# Patient Record
Sex: Female | Born: 1975 | Race: White | Hispanic: No | Marital: Single | Smoking: Current every day smoker
Health system: Southern US, Community
[De-identification: ages and names within clinical notes are randomized; demographics above are authoritative.]

## PROBLEM LIST (undated history)

## (undated) DIAGNOSIS — T7840XA Allergy, unspecified, initial encounter: Secondary | ICD-10-CM

## (undated) DIAGNOSIS — M810 Age-related osteoporosis without current pathological fracture: Secondary | ICD-10-CM

## (undated) HISTORY — PX: COLONOSCOPY: SHX174

## (undated) HISTORY — DX: Age-related osteoporosis without current pathological fracture: M81.0

## (undated) HISTORY — DX: Allergy, unspecified, initial encounter: T78.40XA

---

## 2017-09-15 ENCOUNTER — Other Ambulatory Visit: Payer: Self-pay | Admitting: Family Medicine

## 2017-09-15 ENCOUNTER — Other Ambulatory Visit: Payer: Self-pay

## 2017-09-15 ENCOUNTER — Encounter: Payer: Self-pay | Admitting: Family Medicine

## 2017-09-15 ENCOUNTER — Ambulatory Visit (INDEPENDENT_AMBULATORY_CARE_PROVIDER_SITE_OTHER): Payer: 59

## 2017-09-15 ENCOUNTER — Ambulatory Visit: Payer: 59 | Admitting: Family Medicine

## 2017-09-15 VITALS — BP 100/64 | HR 80 | Temp 99.1°F | Resp 16 | Ht 69.0 in | Wt 148.2 lb

## 2017-09-15 DIAGNOSIS — E2839 Other primary ovarian failure: Secondary | ICD-10-CM

## 2017-09-15 DIAGNOSIS — E28319 Asymptomatic premature menopause: Secondary | ICD-10-CM

## 2017-09-15 DIAGNOSIS — F17210 Nicotine dependence, cigarettes, uncomplicated: Secondary | ICD-10-CM

## 2017-09-15 DIAGNOSIS — M25551 Pain in right hip: Secondary | ICD-10-CM | POA: Diagnosis not present

## 2017-09-15 DIAGNOSIS — F17200 Nicotine dependence, unspecified, uncomplicated: Secondary | ICD-10-CM

## 2017-09-15 DIAGNOSIS — E288 Other ovarian dysfunction: Secondary | ICD-10-CM | POA: Diagnosis not present

## 2017-09-15 DIAGNOSIS — Z1231 Encounter for screening mammogram for malignant neoplasm of breast: Secondary | ICD-10-CM

## 2017-09-15 DIAGNOSIS — M85851 Other specified disorders of bone density and structure, right thigh: Secondary | ICD-10-CM | POA: Diagnosis not present

## 2017-09-15 NOTE — Patient Instructions (Addendum)
IF you received an x-ray today, you will receive an invoice from [facility]. Please contact [facility] at [phone] with questions or concerns regarding your invoice.   IF you received labwork today, you will receive an invoice from [facility]. Please contact [facility] at [phone] with questions or concerns regarding your invoice.   Our billing staff will not be able to assist you with questions regarding bills from these companies.  You will be contacted with the lab results as soon as they are available. The fastest way to get your results is to activate your My Chart account. Instructions are located on the last page of this paperwork. If you have not heard from [place] regarding the results in 2 weeks, please contact this office.    We recommend that you schedule a mammogram for breast cancer screening. Typically, you do not need a referral to do this. Please contact a local imaging center to schedule your mammogram.  [facility] - [phone]  *ask for the Radiology Department [facility]) - [phone] or [phone]  [facility] - [phone] [facility] - [phone] [facility] - [phone]  *ask for the Radiology Department [facility] - [phone]  *ask for the Radiology Department [facility] - [phone]  *ask for the Mammography Department [facility] - [phone] Hip Pain The hip is the joint between the upper legs and the lower pelvis. The bones, cartilage, tendons, and muscles of your hip joint support your body and allow you to move around. Hip pain can range from a minor ache to severe pain in one or both of your hips. The pain may be felt on the inside of the hip joint near the groin, or the outside near the buttocks and upper thigh. You may also have swelling or stiffness. Follow these instructions at home: Managing pain,  stiffness, and swelling  If directed, apply ice to the injured area. ? Put ice in a plastic bag. ? Place a towel between your skin and the bag. ? Leave the ice on for 20 minutes, 2-3 times a day  Sleep with a pillow between your legs on your most comfortable side.  Avoid any activities that cause pain. General instructions  Take over-the-counter and prescription medicines only as told by your health care provider.  Do any exercises as told by your health care provider.  Record the following: ? How often you have hip pain. ? The location of your pain. ? What the pain feels like. ? What makes the pain worse.  Keep all follow-up visits as told by your health care provider. This is important. Contact a health care provider if:  You cannot put weight on your leg.  Your pain or swelling continues or gets worse after one week.  It gets harder to walk.  You have a fever. Get help right away if:  You fall.  You have a sudden increase in pain and swelling in your hip.  Your hip is red or swollen or very tender to touch. Summary  Hip pain can range from a minor ache to severe pain in one or both of your hips.  The pain may be felt on the inside of the hip joint near the groin, or the outside near the buttocks and upper thigh.  Avoid any activities that cause pain.  Record how often you have hip pain, the location of the pain,  what makes it worse and what it feels like. This information is not intended to replace advice given to you by your health care provider. Make sure you discuss any questions you have with your health care provider. Document Released: 08/06/2009 Document Revised: 01/20/2016 Document Reviewed: 01/20/2016 Elsevier Interactive Patient Education  [employer]2018 [employer] Inc.

## 2017-09-15 NOTE — Progress Notes (Signed)
Chief Complaint  Patient presents with  . New Patient (Initial Visit)  . right hip pain    x 1 week, no otc meds, goes to pool and works it out.    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 Allergy   . Osteoporosis    right hip    No current outpatient medications on file.   No current facility-administered medications for this visit.     Allergies: No Known Allergies  History reviewed. No pertinent surgical history.  Social History   Socioeconomic History  . Marital status: Single    Spouse name: Not on file  . Number of children: Not on file  . Years of education: Not on file  . Highest education level: Not on file  Occupational History  . Not on file  Social Needs  . Financial resource strain: Not on file  . Food insecurity:    Worry: Not on file    Inability: Not on file  . Transportation needs:    Medical: Not on file    Non-medical: Not on file  Tobacco Use  . Smoking status: Current Every Day Smoker    Packs/day: 0.50    Years: 20.00    Pack years: 10.00    Types: Cigarettes  . Smokeless tobacco: Never Used  Substance and Sexual Activity  . Alcohol use: Yes    Alcohol/week: 0.6 - 1.2 oz    Types: 1 - 2 Glasses of wine per week  . Drug use: Never  . Sexual activity: Not on file  Lifestyle  . Physical activity:    Days  per week: Not on file    Minutes per session: Not on file  . Stress: Not on file  Relationships  . Social connections:    Talks on phone: Not on file    Gets together: Not on file    Attends religious service: Not on file    Active member of club or organization: Not on file    Attends meetings of clubs or organizations: Not on file    Relationship status: Not on file  Other Topics Concern  . Not on file  Social History Narrative  . Not on file    Family History  Problem Relation Age of Onset  . Heart disease Neg Hx   . Obesity Neg Hx      ROS Review of Systems See HPI Constitution: No fevers or chills No malaise No diaphoresis Skin: No rash or itching Eyes: no blurry vision, no double vision GU: no dysuria or hematuria Neuro: no dizziness or headaches all others reviewed and negative   Objective: Vitals:   09/15/17 1151  BP: 100/64  Pulse: 80  Resp: 16  Temp: 99.1 F (37.3 C)  TempSrc: Oral  SpO2: 97%  Weight: 148 lb 3.2 oz (67.2 kg)  Height: 5\' 9"  (1.753 m)    Physical Exam  Constitutional: She is oriented to person, place, and time. She appears well-developed and well-nourished.  HENT:  Head: Normocephalic and atraumatic.  Eyes: Conjunctivae and EOM are normal.  Cardiovascular: Normal rate, regular rhythm and normal heart sounds.  No murmur heard. Pulmonary/Chest: Effort normal and breath sounds normal. No stridor. No respiratory distress. She has no wheezes.  Musculoskeletal:       Right hip: She exhibits tenderness and bony tenderness. She exhibits normal range of motion, normal strength, no swelling, no crepitus and no deformity.       Left hip: Normal. She exhibits normal range of motion, normal strength, no tenderness, no bony tenderness and no swelling.       Right knee: She exhibits normal range of motion, no swelling and no effusion. No tenderness found.       Left knee: She exhibits normal range of motion, no swelling and no effusion. No  tenderness found.  Neurological: She is alert and oriented to person, place, and time.  Skin: Skin is warm. Capillary refill takes less than 2 seconds.  Psychiatric: She has a normal mood and affect. Her behavior is normal. Judgment and thought content normal.   CLINICAL DATA:  Pain  EXAM: DG HIP (WITH OR WITHOUT PELVIS) 2V BILAT  COMPARISON:  None.  FINDINGS: Frontal pelvis with frontal and lateral views of each hip obtained. No fracture or dislocation. Joint spaces appear normal. No erosive change.  IMPRESSION: No fracture or dislocation.  No evident arthropathy.   Electronically Signed   By: Bretta BangWilliam  Woodruff III M.D.   On: 09/15/2017 12:35    Assessment and Plan Para MarchJeanette was seen today for new patient (initial visit) and right hip pain.  Diagnoses and all orders for this visit:  Acute right hip pain- discussed referral Advised NSAIDs Discussed evaluation for other pathology such as ligamentous injury -     DG HIPS BILAT W OR W/O PELVIS 2V; Future -     Ambulatory referral to Orthopedic Surgery  Tobacco use disorder- discussed smoking cessation  Osteopenia of right hip Premature ovarian failure Early menopause occurring in patient age younger than 45 years -     Ambulatory referral to Orthopedic Surgery -     Discussed following up with Orthopedics -     Xray today no bone disease      Halli Equihua A Adarrius Graeff

## 2017-09-24 ENCOUNTER — Telehealth: Payer: Self-pay | Admitting: Family Medicine

## 2017-09-24 NOTE — Telephone Encounter (Signed)
I called and talked with pt. Pt is very upset that she didn't get MRI done.Pt states that she felt like she was manipulated into getting a cortisone injection for bursitis . She states that she is in a lot of pain. I asked the pt if she would like to be seen today or tommrow with another provider that Dr. Creta LevinStallings not in the office today Pt states no that she could not drive today and someone is coming tomorrow to fix her A.C. Unit. I stated for the pt to seek medical help if her symptoms worsen. Pt would like to talk with Dr. Creta LevinStallings.

## 2017-09-24 NOTE — Telephone Encounter (Signed)
Copied from CRM [id number]. Topic: Quick Communication - See Telephone Encounter >> Sep 24, 2017  8:41 AM Arlyss Gandyichardson, Tykee Heideman N, NT wrote: CRM for notification. See Telephone encounter for: 09/24/17. Pt would like a call to discuss her hip pain. She was thinking she would have a MRI done, but the orthopedic doctor only did a cortisone injection in her right hip. She states during her  appointment with Dr. Creta LevinStallings she mentioned a MRI and to have both hips checked but she states during her orthopedic visit this did not happen or get scheduled. Patient requesting a call back.

## 2017-09-28 ENCOUNTER — Telehealth: Payer: Self-pay

## 2017-09-28 NOTE — Telephone Encounter (Signed)
[facility] calling to advise pt is upset that no one has called her back re: MRI and pain she is still in.  Pt spoke with Lynda on 09/24/17 and was offered an appt on Friday or Saturday pt declined due to other obligations.   Pt calling [facility] today that no one has called her back and she has left messages for her doctor to call her.  [facility] is asking for someone to speak with pt.  Spoke with pt an advised I could give dr Creta Levinstallings the message re: the bursitis dx given by ortho and how she felt they twisted her arm in to getting it instead of MRI and pt goes on to c/o how they laughed at her and dr Creta Levinstallings dx/referral. Pt continues to go on about how much pain she is still in and how no one cares and she will have to find another office as she is super disappointed in the treatment she has received.  Again I advised I work directly with dr Creta Levinstallings and can give her the message to give you a call.  Pt  goes on to point out why wasn't the message given on Friday I advised dr Creta Levinstallings is off on Friday as well as myself.  I advised dr Creta Levinstallings is our new Wellsite geologistmedical director and sees pt's during clinics and can not always return calls same day as in this situation she could not call you back because she wasn't here.  I offered again to give message but pt states she is tried of the whole situation and will be seeking care elsewhere.  I advised pt that is her choice and wished her a good day.

## 2017-10-04 NOTE — Telephone Encounter (Signed)
Spoke with patient on 09/28/17 to find out how best to help her with this matter.  Expressed that she can go to a different orthopedic group for second opinion.  Discussed that it is up the specialist to manage how they see fit and that if her pain is unimproved that she can be referred else where. She states "I don't know how I will handle this."  Asked her if there is anything else that can be done and she stated "not at this time".

## 2017-10-06 ENCOUNTER — Ambulatory Visit
Admission: RE | Admit: 2017-10-06 | Discharge: 2017-10-06 | Disposition: A | Discharge status: Home / Self Care | Payer: 59 | Source: Ambulatory Visit | Attending: Family Medicine | Admitting: Family Medicine

## 2017-10-06 DIAGNOSIS — Z1231 Encounter for screening mammogram for malignant neoplasm of breast: Secondary | ICD-10-CM

## 2017-10-07 ENCOUNTER — Other Ambulatory Visit: Payer: Self-pay | Admitting: Family Medicine

## 2017-10-07 DIAGNOSIS — R928 Other abnormal and inconclusive findings on diagnostic imaging of breast: Secondary | ICD-10-CM

## 2017-10-11 ENCOUNTER — Ambulatory Visit
Admission: RE | Admit: 2017-10-11 | Discharge: 2017-10-11 | Disposition: A | Discharge status: Home / Self Care | Payer: 59 | Source: Ambulatory Visit | Attending: Family Medicine | Admitting: Family Medicine

## 2017-10-11 DIAGNOSIS — R928 Other abnormal and inconclusive findings on diagnostic imaging of breast: Secondary | ICD-10-CM

## 2017-11-24 ENCOUNTER — Encounter: Payer: Self-pay | Admitting: Family Medicine

## 2017-11-24 ENCOUNTER — Telehealth: Payer: Self-pay | Admitting: Family Medicine

## 2017-11-24 NOTE — Telephone Encounter (Signed)
Copied from CRM [id number]. 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

## 2017-11-25 NOTE — Telephone Encounter (Signed)
Patient was advised that the orthopedic doctor could write her the note.  Or she could come back in to be seen. Patient denied appointment and said she would go elsewhere

## 2019-07-25 IMAGING — MG DIGITAL DIAGNOSTIC UNILATERAL RIGHT MAMMOGRAM
1 series · 2 of 5 positions shown · non-contrast
Comparison: Previous exam(s).

CLINICAL DATA: Screening recall.  Patient has no breast complaints.

EXAM:
DIGITAL DIAGNOSTIC RIGHT MAMMOGRAM

[R ML tomo · 2 of 67 frames shown]
[frame 22/67]
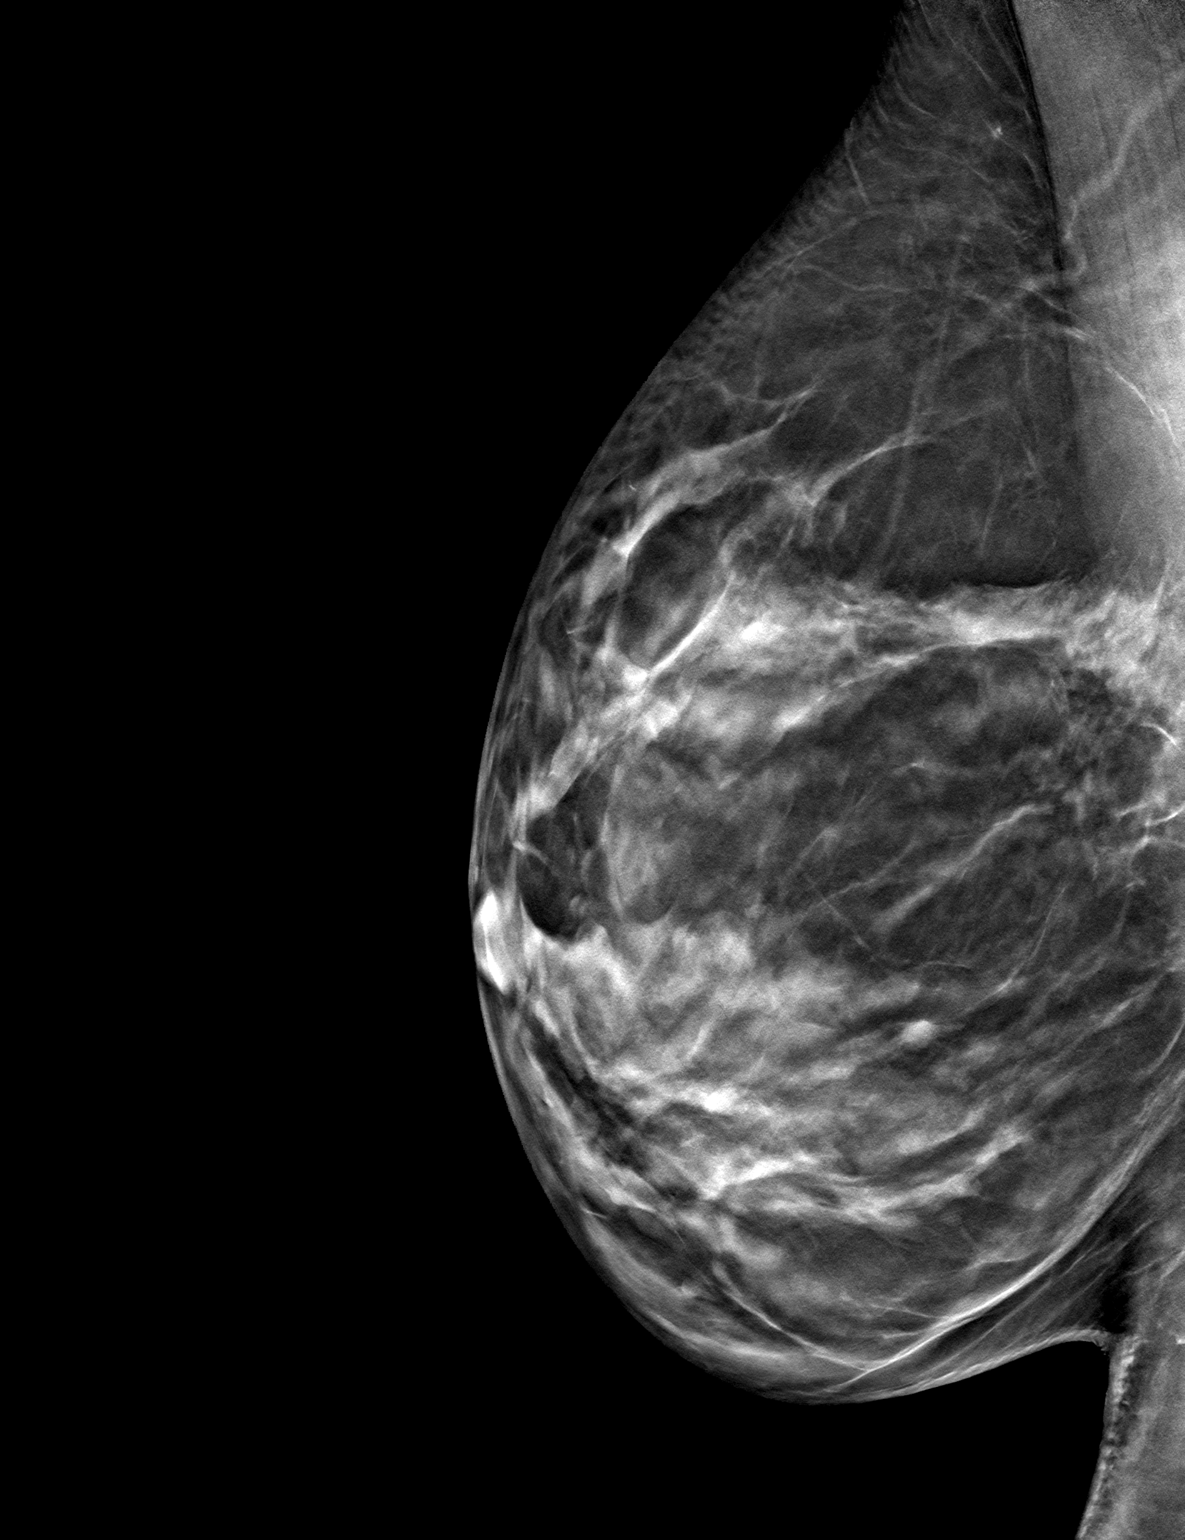
[frame 34/67]
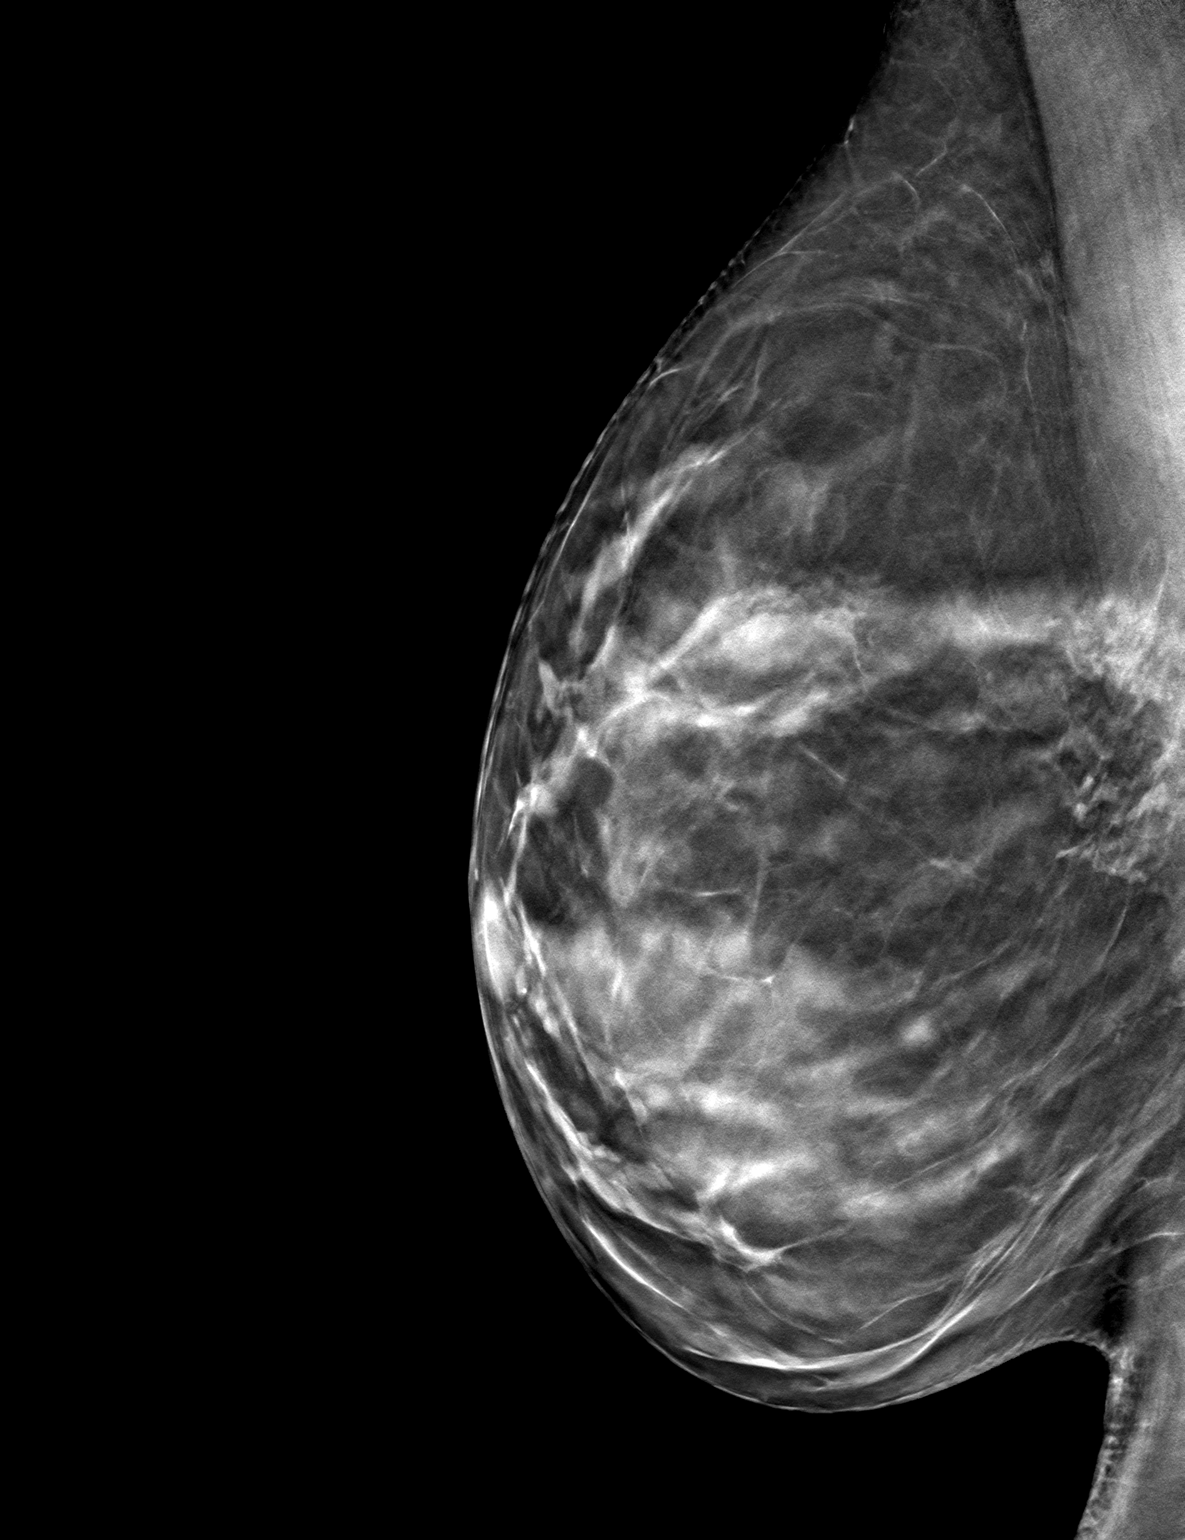

[2 of 5 positions shown; findings below may reference images not displayed]

ACR Breast Density Category c: The breast tissue is heterogeneously
dense, which may obscure small masses.
FINDINGS: On spot-compression imaging, the asymmetry disperses consistent
normal fibroglandular tissue. There is no underlying mass. There are
no calcifications. Is no architectural distortion.
IMPRESSION: Negative exam.  No evidence of breast malignancy.

RECOMMENDATION:
Screening mammogram in one year.(Code:[id number])

I have discussed the findings and recommendations with the patient.
Results were also provided in writing at the conclusion of the
visit. If applicable, a reminder letter will be sent to the patient
regarding the next appointment.

BI-RADS CATEGORY  1: Negative.

## 2022-05-11 ENCOUNTER — Ambulatory Visit
Admission: RE | Admit: 2022-05-11 | Discharge: 2022-05-11 | Disposition: A | Discharge status: Home / Self Care | Payer: BC Managed Care – PPO | Source: Ambulatory Visit | Attending: Family Medicine | Admitting: Family Medicine

## 2022-05-11 ENCOUNTER — Other Ambulatory Visit: Payer: Self-pay | Admitting: Family Medicine

## 2022-05-11 DIAGNOSIS — L729 Follicular cyst of the skin and subcutaneous tissue, unspecified: Secondary | ICD-10-CM

## 2022-05-12 ENCOUNTER — Other Ambulatory Visit: Payer: Self-pay | Admitting: Family Medicine

## 2022-05-12 ENCOUNTER — Encounter: Payer: Self-pay | Admitting: Family Medicine

## 2022-05-12 DIAGNOSIS — M899 Disorder of bone, unspecified: Secondary | ICD-10-CM

## 2022-05-15 ENCOUNTER — Encounter: Payer: Self-pay | Admitting: Family Medicine

## 2022-05-18 ENCOUNTER — Ambulatory Visit
Admission: RE | Admit: 2022-05-18 | Discharge: 2022-05-18 | Disposition: A | Discharge status: Home / Self Care | Payer: BC Managed Care – PPO | Source: Ambulatory Visit | Attending: Family Medicine | Admitting: Family Medicine

## 2022-05-18 DIAGNOSIS — M899 Disorder of bone, unspecified: Secondary | ICD-10-CM

## 2022-05-29 ENCOUNTER — Encounter (HOSPITAL_COMMUNITY): Payer: Self-pay | Admitting: Otolaryngology

## 2022-05-29 ENCOUNTER — Other Ambulatory Visit: Payer: Self-pay

## 2022-05-29 NOTE — Anesthesia Preprocedure Evaluation (Addendum)
Anesthesia Evaluation  Patient identified by MRN, date of birth, ID band Patient awake    Reviewed: Allergy & Precautions, NPO status , Patient's Chart, lab work & pertinent test results  History of Anesthesia Complications Negative for: history of anesthetic complications  Airway Mallampati: II  TM Distance: >3 FB Neck ROM: Full    Dental  (+) Dental Advisory Given   Pulmonary Current Smoker and Patient abstained from smoking.   breath sounds clear to auscultation       Cardiovascular negative cardio ROS  Rhythm:Regular Rate:Normal     Neuro/Psych negative neurological ROS     GI/Hepatic negative GI ROS, Neg liver ROS,,,  Endo/Other  negative endocrine ROS    Renal/GU negative Renal ROS     Musculoskeletal   Abdominal   Peds  Hematology negative hematology ROS (+)   Anesthesia Other Findings   Reproductive/Obstetrics                             Anesthesia Physical Anesthesia Plan  ASA: 2  Anesthesia Plan: General   Post-op Pain Management: Tylenol PO (pre-op)*   Induction: Intravenous  PONV Risk Score and Plan: 2 and Ondansetron and Dexamethasone  Airway Management Planned: LMA  Additional Equipment: None  Intra-op Plan:   Post-operative Plan:   Informed Consent: I have reviewed the patients History and Physical, chart, labs and discussed the procedure including the risks, benefits and alternatives for the proposed anesthesia with the patient or authorized representative who has indicated his/her understanding and acceptance.     Dental advisory given  Plan Discussed with: CRNA and Surgeon  Anesthesia Plan Comments:         Anesthesia Quick Evaluation

## 2022-05-29 NOTE — Progress Notes (Addendum)
SDW call  Patient aware to check in at 0600 at ER registration and then come up to SSS.    PCP -  Almedia Balls, NP at [place] - Denies   Chest x-ray - n/a EKG -  n/a Stress Test - n/a ECHO - n/a Cardiac Cath - n/a   Sleep Study/sleep apnea/CPAP: denies  Non-diabetic   Blood Thinner Instructions: Denies Aspirin Instructions: Denies   ERAS Protcol - No, NPO PRE-SURGERY Ensure or G2- No   COVID TEST- n/a    Anesthesia review: No   Patient denies shortness of breath, fever, cough and chest pain over the phone call    Your procedure is scheduled on Saturday May 30, 2022  Report to [facility] Main Entrance "A" at  0600  A.M., then check in with the Admitting office.  Call this number if you have problems the morning of surgery:  [phone]   If you have any questions prior to your surgery date call [phone]: Open Monday-Friday 8am-4pm If you experience any cold or flu symptoms such as cough, fever, chills, shortness of breath, etc. between now and your scheduled surgery, please notify us at the above number    Remember:  Do not eat or drink after midnight the night before your surgery   Take these medicines as needed the morning of surgery with A SIP OF WATER: Tylenol  As of today, STOP taking any Aspirin (unless otherwise instructed by your surgeon) Aleve, Naproxen, Ibuprofen, Motrin, Advil, Goody's, BC's, all herbal medications, fish oil, and all vitamins.

## 2022-05-30 ENCOUNTER — Other Ambulatory Visit: Payer: Self-pay

## 2022-05-30 ENCOUNTER — Ambulatory Visit (HOSPITAL_COMMUNITY)
Admission: RE | Admit: 2022-05-30 | Discharge: 2022-05-30 | Disposition: A | Discharge status: Home / Self Care | Payer: BC Managed Care – PPO | Attending: Otolaryngology | Admitting: Otolaryngology

## 2022-05-30 ENCOUNTER — Ambulatory Visit (HOSPITAL_COMMUNITY): Payer: BC Managed Care – PPO | Admitting: Anesthesiology

## 2022-05-30 ENCOUNTER — Encounter (HOSPITAL_COMMUNITY)
Admission: RE | Disposition: A | Discharge status: Home / Self Care | Payer: Self-pay | Source: Home / Self Care | Attending: Otolaryngology

## 2022-05-30 ENCOUNTER — Encounter (HOSPITAL_COMMUNITY): Payer: Self-pay | Admitting: Otolaryngology

## 2022-05-30 DIAGNOSIS — J3489 Other specified disorders of nose and nasal sinuses: Secondary | ICD-10-CM | POA: Insufficient documentation

## 2022-05-30 DIAGNOSIS — F17210 Nicotine dependence, cigarettes, uncomplicated: Secondary | ICD-10-CM | POA: Insufficient documentation

## 2022-05-30 DIAGNOSIS — R220 Localized swelling, mass and lump, head: Secondary | ICD-10-CM | POA: Diagnosis present

## 2022-05-30 DIAGNOSIS — H6993 Unspecified Eustachian tube disorder, bilateral: Secondary | ICD-10-CM | POA: Diagnosis not present

## 2022-05-30 DIAGNOSIS — H903 Sensorineural hearing loss, bilateral: Secondary | ICD-10-CM | POA: Insufficient documentation

## 2022-05-30 HISTORY — PX: EXCISION NASAL MASS: SHX6271

## 2022-05-30 LAB — CBC
HCT: 39.3 % (ref 36.0–46.0)
Hemoglobin: 13.6 g/dL (ref 12.0–15.0)
MCH: 30.8 pg (ref 26.0–34.0)
MCHC: 34.6 g/dL (ref 30.0–36.0)
MCV: 88.9 fL (ref 80.0–100.0)
Platelets: 267 10*3/uL (ref 150–400)
RBC: 4.42 MIL/uL (ref 3.87–5.11)
RDW: 12 % (ref 11.5–15.5)
WBC: 7.1 10*3/uL (ref 4.0–10.5)
nRBC: 0 % (ref 0.0–0.2)

## 2022-05-30 SURGERY — EXCISION, MASS, NOSE
Anesthesia: General | Site: Face | Laterality: Left

## 2022-05-30 MED ORDER — HYDROMORPHONE HCL 1 MG/ML IJ SOLN
INTRAMUSCULAR | Status: AC
  Filled 2022-05-30: qty 1

## 2022-05-30 MED ORDER — MIDAZOLAM HCL 2 MG/2ML IJ SOLN
INTRAMUSCULAR | Status: DC | PRN
  Administered 2022-05-30: 2 mg via INTRAVENOUS

## 2022-05-30 MED ORDER — CEFAZOLIN SODIUM-DEXTROSE 2-3 GM-%(50ML) IV SOLR
INTRAVENOUS | Status: DC | PRN
  Administered 2022-05-30: 2 g via INTRAVENOUS

## 2022-05-30 MED ORDER — LIDOCAINE 2% (20 MG/ML) 5 ML SYRINGE
INTRAMUSCULAR | Status: AC
  Filled 2022-05-30: qty 5

## 2022-05-30 MED ORDER — DEXAMETHASONE SODIUM PHOSPHATE 10 MG/ML IJ SOLN
INTRAMUSCULAR | Status: DC | PRN
  Administered 2022-05-30: 10 mg via INTRAVENOUS

## 2022-05-30 MED ORDER — ORAL CARE MOUTH RINSE: 15.0000 mL | Freq: Once | OROMUCOSAL | Status: AC

## 2022-05-30 MED ORDER — LIDOCAINE 2% (20 MG/ML) 5 ML SYRINGE
INTRAMUSCULAR | Status: DC | PRN
  Administered 2022-05-30: 20 mg via INTRAVENOUS

## 2022-05-30 MED ORDER — FENTANYL CITRATE (PF) 250 MCG/5ML IJ SOLN
INTRAMUSCULAR | Status: AC
  Filled 2022-05-30: qty 5

## 2022-05-30 MED ORDER — MIDAZOLAM HCL 2 MG/2ML IJ SOLN
INTRAMUSCULAR | Status: AC
  Filled 2022-05-30: qty 2

## 2022-05-30 MED ORDER — HYDROMORPHONE HCL 1 MG/ML IJ SOLN
0.2500 mg | INTRAMUSCULAR | Status: DC | PRN
  Administered 2022-05-30: 0.5 mg via INTRAVENOUS

## 2022-05-30 MED ORDER — PROPOFOL 10 MG/ML IV BOLUS
INTRAVENOUS | Status: DC | PRN
  Administered 2022-05-30: 200 mg via INTRAVENOUS

## 2022-05-30 MED ORDER — HEMOSTATIC AGENTS (NO CHARGE) OPTIME
TOPICAL | Status: DC | PRN
  Administered 2022-05-30: 1 via TOPICAL

## 2022-05-30 MED ORDER — FENTANYL CITRATE (PF) 250 MCG/5ML IJ SOLN
INTRAMUSCULAR | Status: DC | PRN
  Administered 2022-05-30 (×2): 25 ug via INTRAVENOUS

## 2022-05-30 MED ORDER — ONDANSETRON HCL 4 MG/2ML IJ SOLN
INTRAMUSCULAR | Status: DC | PRN
  Administered 2022-05-30: 4 mg via INTRAVENOUS

## 2022-05-30 MED ORDER — LACTATED RINGERS IV SOLN: INTRAVENOUS | Status: DC

## 2022-05-30 MED ORDER — LIDOCAINE-EPINEPHRINE 1 %-1:100000 IJ SOLN
INTRAMUSCULAR | Status: DC | PRN
  Administered 2022-05-30: .5 mL

## 2022-05-30 MED ORDER — CEFAZOLIN SODIUM 1 G IJ SOLR
INTRAMUSCULAR | Status: AC
  Filled 2022-05-30: qty 20

## 2022-05-30 MED ORDER — DEXAMETHASONE SODIUM PHOSPHATE 10 MG/ML IJ SOLN
INTRAMUSCULAR | Status: AC
  Filled 2022-05-30: qty 1

## 2022-05-30 MED ORDER — CHLORHEXIDINE GLUCONATE 0.12 % MT SOLN
OROMUCOSAL | Status: AC
  Administered 2022-05-30: 15 mL via OROMUCOSAL
  Filled 2022-05-30: qty 15

## 2022-05-30 MED ORDER — AMOXICILLIN 875 MG PO TABS: 875.0000 mg | ORAL_TABLET | Freq: Two times a day (BID) | ORAL | 0 refills | Status: AC

## 2022-05-30 MED ORDER — OXYCODONE HCL 5 MG/5ML PO SOLN: 5.0000 mg | Freq: Once | ORAL | Status: DC | PRN

## 2022-05-30 MED ORDER — LIDOCAINE-EPINEPHRINE 1 %-1:100000 IJ SOLN
INTRAMUSCULAR | Status: AC
  Filled 2022-05-30: qty 1

## 2022-05-30 MED ORDER — CHLORHEXIDINE GLUCONATE 0.12 % MT SOLN: 15.0000 mL | Freq: Once | OROMUCOSAL | Status: AC

## 2022-05-30 MED ORDER — PHENYLEPHRINE 80 MCG/ML (10ML) SYRINGE FOR IV PUSH (FOR BLOOD PRESSURE SUPPORT)
PREFILLED_SYRINGE | INTRAVENOUS | Status: DC | PRN
  Administered 2022-05-30 (×2): 120 ug via INTRAVENOUS
  Administered 2022-05-30 (×3): 80 ug via INTRAVENOUS

## 2022-05-30 MED ORDER — ACETAMINOPHEN 500 MG PO TABS: 1000.0000 mg | ORAL_TABLET | Freq: Once | ORAL | Status: AC

## 2022-05-30 MED ORDER — BACITRACIN ZINC 500 UNIT/GM EX OINT
TOPICAL_OINTMENT | CUTANEOUS | Status: AC
  Filled 2022-05-30: qty 28.35

## 2022-05-30 MED ORDER — PROPOFOL 10 MG/ML IV BOLUS
INTRAVENOUS | Status: AC
  Filled 2022-05-30: qty 20

## 2022-05-30 MED ORDER — MIDAZOLAM HCL 2 MG/2ML IJ SOLN: 0.5000 mg | Freq: Once | INTRAMUSCULAR | Status: DC | PRN

## 2022-05-30 MED ORDER — ACETAMINOPHEN 500 MG PO TABS
ORAL_TABLET | ORAL | Status: AC
  Administered 2022-05-30: 1000 mg via ORAL
  Filled 2022-05-30: qty 2

## 2022-05-30 MED ORDER — OXYCODONE HCL 5 MG PO TABS: 5.0000 mg | ORAL_TABLET | Freq: Once | ORAL | Status: DC | PRN

## 2022-05-30 MED ORDER — MEPERIDINE HCL 25 MG/ML IJ SOLN: 6.2500 mg | INTRAMUSCULAR | Status: DC | PRN

## 2022-05-30 MED ORDER — ONDANSETRON HCL 4 MG/2ML IJ SOLN
INTRAMUSCULAR | Status: AC
  Filled 2022-05-30: qty 2

## 2022-05-30 MED ORDER — PROMETHAZINE HCL 25 MG/ML IJ SOLN: 6.2500 mg | INTRAMUSCULAR | Status: DC | PRN

## 2022-05-30 SURGICAL SUPPLY — 29 items
ADH SKN CLS APL DERMABOND .7 (GAUZE/BANDAGES/DRESSINGS) ×1
BAG COUNTER SPONGE SURGICOUNT (BAG) ×1 IMPLANT
BAG SPNG CNTER NS LX DISP (BAG) ×1
CANISTER SUCT 3000ML PPV (MISCELLANEOUS) IMPLANT
CLEANER TIP ELECTROSURG 2X2 (MISCELLANEOUS) ×1 IMPLANT
CNTNR URN SCR LID CUP LEK RST (MISCELLANEOUS) ×1 IMPLANT
CONT SPEC 4OZ STRL OR WHT (MISCELLANEOUS) ×1
COVER SURGICAL LIGHT HANDLE (MISCELLANEOUS) ×1 IMPLANT
DERMABOND ADVANCED .7 DNX12 (GAUZE/BANDAGES/DRESSINGS) ×1 IMPLANT
ELECT COATED BLADE 2.86 ST (ELECTRODE) ×1 IMPLANT
ELECT REM PT RETURN 9FT ADLT (ELECTROSURGICAL) ×1
ELECTRODE REM PT RTRN 9FT ADLT (ELECTROSURGICAL) ×1 IMPLANT
GLOVE ECLIPSE 7.5 STRL STRAW (GLOVE) ×1 IMPLANT
GOWN STRL REUS W/ TWL LRG LVL3 (GOWN DISPOSABLE) ×2 IMPLANT
GOWN STRL REUS W/ TWL XL LVL3 (GOWN DISPOSABLE) ×1 IMPLANT
GOWN STRL REUS W/TWL LRG LVL3 (GOWN DISPOSABLE) ×1
GOWN STRL REUS W/TWL XL LVL3 (GOWN DISPOSABLE) ×1
HEMOSTAT SNOW SURGICEL 2X4 (HEMOSTASIS) IMPLANT
KIT BASIN OR (CUSTOM PROCEDURE TRAY) ×1 IMPLANT
KIT TURNOVER KIT B (KITS) ×1 IMPLANT
NDL HYPO 25GX1X1/2 BEV (NEEDLE) IMPLANT
NEEDLE HYPO 25GX1X1/2 BEV (NEEDLE) ×1 IMPLANT
NS IRRIG 1000ML POUR BTL (IV SOLUTION) ×1 IMPLANT
PAD ARMBOARD 7.5X6 YLW CONV (MISCELLANEOUS) ×2 IMPLANT
PENCIL SMOKE EVACUATOR (MISCELLANEOUS) ×1 IMPLANT
POSITIONER HEAD DONUT 9IN (MISCELLANEOUS) IMPLANT
SUT VIC AB 4-0 PS2 18 (SUTURE) IMPLANT
TRAY ENT MC OR (CUSTOM PROCEDURE TRAY) ×1 IMPLANT
WATER STERILE IRR 1000ML POUR (IV SOLUTION) ×1 IMPLANT

## 2022-05-30 NOTE — H&P (Signed)
Cc: Left facial/nasal mass  HPI: The patient is a 47 year old female who presents today complaining of a left facial/nasal mass.  In addition, she also complains of clogging sensation in her ears, with bilateral muffled hearing.  According to the patient, she first noted a small bump on the left side of her nose 2 years ago.  It has gradually increased in size.  She recently underwent a sinus CT scan.  The CT showed a 1 cm soft tissue mass that extends through the cortex of the left nasal bone, concerning for a neoplastic process.  Currently the patient denies any facial or nasal pain.  She also denies any visual change.  She has no previous history of ENT surgery.  The patient also complains of chronic clogging sensation in her ears and muffled hearing.  She was recently treated with antibiotics.  She has no previous history of ENT surgery.  She is able to breathe through both nostrils.  The patient's review of systems (constitutional, eyes, ENT, cardiovascular, respiratory, GI, musculoskeletal, skin, neurologic, psychiatric, endocrine, hematologic, allergic) is noted in the ROS questionnaire.  It is reviewed with the patient.  Major events: None.  Ongoing medical problems: None.  Family health history: No HTN, DM, CAD, hearing loss or bleeding disorder.  Social history: The patient is single. She smokes 0.5 pack of cigarettes a day. She denies the use of alcohol or illegal drugs.    Exam: General: Communicates without difficulty, well nourished, no acute distress. Head: Normocephalic, no evidence injury, no tenderness, facial buttresses intact without stepoff. Face/sinus: No tenderness to palpation and percussion. Facial movement is normal and symmetric. Eyes: PERRL, EOMI. No scleral icterus, conjunctivae clear. Neuro: CN II exam reveals vision grossly intact.  No nystagmus at any point of gaze. Ears: Auricles well formed without lesions.  Ear canals are intact without mass or lesion.  No erythema or  edema is appreciated.  The TMs are intact but mildly retracted. Nose: External evaluation reveals a small palpable lesion on the left nasal bone.  Dorsum is intact.  Anterior rhinoscopy reveals congested mucosa over anterior aspect of inferior turbinates and intact septum.  No purulence noted. Oral:  Oral cavity and oropharynx are intact, symmetric, without erythema or edema.  Mucosa is moist without lesions. Neck: Full range of motion without pain.  There is no significant lymphadenopathy.  No masses palpable.  Thyroid bed within normal limits to palpation.  Parotid glands and submandibular glands equal bilaterally without mass.  Trachea is midline. Neuro:  CN 2-12 grossly intact. Gait normal. A flexible scope was inserted into the right nasal cavity.  Endoscopy of the interior nasal cavity, superior, inferior, and middle meatus was performed. The sphenoid-ethmoid recess was examined. Edematous mucosa was noted.  No polyp, mass, or lesion was appreciated. Olfactory cleft was clear.  Nasopharynx was clear.  Turbinates were hypertrophied but without mass.  The procedure was repeated on the contralateral side with similar findings.  A soft tissue mass is visible within the left superior anterior nasal cavity.  The patient tolerated the procedure well.   Assessment  1.  The patient has a 1 cm soft tissue mass within the superior anterior left nasal cavity.  The soft tissue mass has eroded through the nasal bone.  It is now palpable subcutaneously.  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 2.  Bilateral eustachian tube dysfunction. 3.  Minimal bilateral high-frequency sensorineural hearing loss.  Plan  1.  The physical exam and nasal endoscopy findings  are reviewed with the patient.  The hearing test results also reviewed. 2.  Based on the above findings, the patient will need to undergo biopsy/excision of the facial/nasal mass.  This procedure will be performed under anesthesia in the operating  room.

## 2022-05-30 NOTE — Discharge Instructions (Addendum)
The patient may resume all her previous activities and diet.  She may use Tylenol/ibuprofen as needed for pain control.  She may also use cold compresses to the surgical area.

## 2022-05-30 NOTE — Transfer of Care (Signed)
Immediate Anesthesia Transfer of Care Note  Patient: Morgan Hughes  Procedure(s) Performed: EXCISION FACIAL MASS (Left: Face)  Patient Location: PACU  Anesthesia Type:General  Level of Consciousness: awake, alert , and oriented  Airway & Oxygen Therapy: Patient Spontanous Breathing and Patient connected to nasal cannula oxygen  Post-op Assessment: Report given to RN and Post -op Vital signs reviewed and stable  Post vital signs: Reviewed and stable  Last Vitals:  Vitals Value Taken Time  BP 102/75 05/30/22 0842  Temp    Pulse 75 05/30/22 0843  Resp 13 05/30/22 0843  SpO2 98 % 05/30/22 0843  Vitals shown include unvalidated device data.  Last Pain:  Vitals:   05/30/22 0635  TempSrc: Oral  PainSc: 0-No pain         Complications: No notable events documented.

## 2022-05-30 NOTE — Anesthesia Procedure Notes (Signed)
Procedure Name: LMA Insertion Date/Time: 05/30/2022 7:41 AM  Performed by: Heide Scales, CRNAPre-anesthesia Checklist: Patient identified, Emergency Drugs available, Suction available and Patient being monitored Patient Re-evaluated:Patient Re-evaluated prior to induction Oxygen Delivery Method: Circle system utilized Preoxygenation: Pre-oxygenation with 100% oxygen Induction Type: IV induction LMA: LMA inserted LMA Size: 4.0 Airway Equipment and Method: Bite block Placement Confirmation: positive ETCO2 and breath sounds checked- equal and bilateral Tube secured with: Tape Dental Injury: Teeth and Oropharynx as per pre-operative assessment

## 2022-05-30 NOTE — Op Note (Addendum)
DATE OF PROCEDURE: 05/30/2022  OPERATIVE REPORT    SURGEON:  Leta Baptist, MD   PREOPERATIVE DIAGNOSIS: Left facial/nasal mass   POSTOPERATIVE DIAGNOSIS:  Left facial/nasal mass   PROCEDURES PERFORMED: 1.  Open excision/biopsy of left nasal/facial mass (CPT 21011)   ANESTHESIA:  General laryngeal mask anesthesia.   COMPLICATIONS:  None.   ESTIMATED BLOOD LOSS:  Minimal.   INDICATION FOR PROCEDURE:   Morgan Hughes is a 47 y.o. female with a history of a small bump on the left side of her nose 2 years ago.  It has gradually increased in size.  She recently underwent a sinus CT scan.  The CT showed a 1 cm soft tissue mass that extends through the cortex of the left nasal bone, concerning for a neoplastic process.  Attempts to biopsy the mass intranasally was unsuccessful. Based on the above findings, the decision was made for the patient to undergo the above-stated procedure.  The risks, benefits, alternatives, and details of the procedures were discussed with the mother.  Questions were invited and answered.  Informed consent was obtained.   DESCRIPTION OF PROCEDURE:  The patient was taken to the operating room and placed supine on the operating table.  General laryngeal mask anesthesia was induced by the anesthesiologist.   A small palpable protuberance was noted over the left upper nasal bone.  1% lidocaine with 1-100,000 epinephrine was infiltrated locally.  A 1 cm vertical incision was made.  The incision was carried down to the subperiosteal level.  The soft tissue overlying the nasal bone was elevated.  At this time, the patient was noted to have a bony defect over the left lateral nasal bone.  It was eroded by a soft tissue mass.  Multiple biopsy specimens was obtained from the soft tissue mass using a middle ear cup forceps.  The specimens were sent to the pathology department for permanent histologic identification. Hemostasis was achieved with Bovie electrocautery.  The surgical site was  copiously irrigated.  The incision was closed in layers with 4-0 Vicryl and Dermabond.   The care of the patient was turned over to the anesthesiologist.  The patient was awakened from anesthesia without difficulty.  She was extubated and transferred to the recovery room in good condition.   OPERATIVE FINDINGS: A 1 cm nasal soft tissue mass that has eroded the left upper nasal bone.   SPECIMEN: Left nasal/facial mass.   FOLLOWUP CARE:  The patient will be discharged home once she is awake and alert.

## 2022-05-30 NOTE — Anesthesia Postprocedure Evaluation (Signed)
Anesthesia Post Note  Patient: Traci Peers  Procedure(s) Performed: EXCISION FACIAL MASS (Left: Face)     Patient location during evaluation: PACU Anesthesia Type: General Level of consciousness: awake and alert, patient cooperative and oriented Pain management: pain level controlled Vital Signs Assessment: post-procedure vital signs reviewed and stable Respiratory status: spontaneous breathing, nonlabored ventilation and respiratory function stable Cardiovascular status: blood pressure returned to baseline and stable Postop Assessment: no apparent nausea or vomiting Anesthetic complications: no   No notable events documented.  Last Vitals:  Vitals:   05/30/22 0635  BP: 110/71  Pulse: 77  Resp: 20  Temp: 36.6 C  SpO2: 95%    Last Pain:  Vitals:   05/30/22 0635  TempSrc: Oral  PainSc: 0-No pain                 Maximillion Gill,E. Tychelle Purkey

## 2022-05-31 ENCOUNTER — Encounter (HOSPITAL_COMMUNITY): Payer: Self-pay | Admitting: Otolaryngology

## 2022-06-02 LAB — SURGICAL PATHOLOGY

## 2022-06-29 ENCOUNTER — Other Ambulatory Visit: Payer: Self-pay | Admitting: Adult Medicine

## 2022-06-29 DIAGNOSIS — Z1231 Encounter for screening mammogram for malignant neoplasm of breast: Secondary | ICD-10-CM

## 2022-08-03 ENCOUNTER — Other Ambulatory Visit: Payer: Self-pay | Admitting: Nurse Practitioner

## 2022-08-03 DIAGNOSIS — E2839 Other primary ovarian failure: Secondary | ICD-10-CM

## 2022-08-10 ENCOUNTER — Ambulatory Visit
Admission: RE | Admit: 2022-08-10 | Discharge: 2022-08-10 | Disposition: A | Discharge status: Home / Self Care | Payer: BC Managed Care – PPO | Source: Ambulatory Visit | Attending: Adult Medicine | Admitting: Adult Medicine

## 2022-08-10 DIAGNOSIS — Z1231 Encounter for screening mammogram for malignant neoplasm of breast: Secondary | ICD-10-CM

## 2022-10-08 ENCOUNTER — Other Ambulatory Visit: Payer: BC Managed Care – PPO

## 2023-02-18 ENCOUNTER — Ambulatory Visit
Admission: RE | Admit: 2023-02-18 | Discharge: 2023-02-18 | Disposition: A | Discharge status: Home / Self Care | Payer: BC Managed Care – PPO | Source: Ambulatory Visit | Attending: Nurse Practitioner | Admitting: Nurse Practitioner

## 2023-02-18 DIAGNOSIS — E2839 Other primary ovarian failure: Secondary | ICD-10-CM

## 2023-06-28 ENCOUNTER — Other Ambulatory Visit: Payer: Self-pay | Admitting: Family Medicine

## 2023-06-28 DIAGNOSIS — Z1231 Encounter for screening mammogram for malignant neoplasm of breast: Secondary | ICD-10-CM

## 2023-07-05 ENCOUNTER — Other Ambulatory Visit: Payer: Self-pay | Admitting: Physician Assistant

## 2023-07-05 DIAGNOSIS — R519 Headache, unspecified: Secondary | ICD-10-CM

## 2023-07-07 ENCOUNTER — Encounter: Payer: Self-pay | Admitting: Physician Assistant

## 2023-08-07 ENCOUNTER — Other Ambulatory Visit: Payer: Self-pay

## 2023-08-11 ENCOUNTER — Ambulatory Visit: Payer: Self-pay

## 2023-08-18 ENCOUNTER — Ambulatory Visit
Admission: RE | Admit: 2023-08-18 | Discharge: 2023-08-18 | Disposition: A | Discharge status: Home / Self Care | Payer: Self-pay | Source: Ambulatory Visit | Attending: Family Medicine | Admitting: Family Medicine

## 2023-08-18 DIAGNOSIS — Z1231 Encounter for screening mammogram for malignant neoplasm of breast: Secondary | ICD-10-CM

## 2023-09-15 ENCOUNTER — Other Ambulatory Visit (HOSPITAL_BASED_OUTPATIENT_CLINIC_OR_DEPARTMENT_OTHER): Payer: Self-pay | Admitting: Nurse Practitioner

## 2023-09-15 DIAGNOSIS — M810 Age-related osteoporosis without current pathological fracture: Secondary | ICD-10-CM

## 2024-04-11 ENCOUNTER — Other Ambulatory Visit (HOSPITAL_BASED_OUTPATIENT_CLINIC_OR_DEPARTMENT_OTHER)
